# Patient Record
Sex: Female | Born: 1985 | Hispanic: Yes | Marital: Single | Smoking: Never smoker
Health system: Southern US, Community
[De-identification: ages and names within clinical notes are randomized; demographics above are authoritative.]

## PROBLEM LIST (undated history)

## (undated) DIAGNOSIS — Z789 Other specified health status: Secondary | ICD-10-CM

## (undated) DIAGNOSIS — K769 Liver disease, unspecified: Secondary | ICD-10-CM

## (undated) DIAGNOSIS — D649 Anemia, unspecified: Secondary | ICD-10-CM

## (undated) HISTORY — PX: NO PAST SURGERIES: SHX2092

---

## 2010-05-17 ENCOUNTER — Ambulatory Visit (HOSPITAL_COMMUNITY)
Admission: RE | Admit: 2010-05-17 | Discharge: 2010-05-17 | Payer: Self-pay | Source: Home / Self Care | Attending: Family Medicine | Admitting: Family Medicine

## 2010-07-26 ENCOUNTER — Other Ambulatory Visit: Payer: Self-pay | Admitting: Family Medicine

## 2010-07-26 DIAGNOSIS — Z3689 Encounter for other specified antenatal screening: Secondary | ICD-10-CM

## 2010-08-16 ENCOUNTER — Encounter (HOSPITAL_COMMUNITY): Payer: Self-pay

## 2010-08-16 ENCOUNTER — Ambulatory Visit (HOSPITAL_COMMUNITY)
Admission: RE | Admit: 2010-08-16 | Discharge: 2010-08-16 | Disposition: A | Discharge status: Home / Self Care | Payer: Self-pay | Source: Ambulatory Visit | Attending: Family Medicine | Admitting: Family Medicine

## 2010-08-16 DIAGNOSIS — Z3689 Encounter for other specified antenatal screening: Secondary | ICD-10-CM

## 2010-08-16 DIAGNOSIS — O0930 Supervision of pregnancy with insufficient antenatal care, unspecified trimester: Secondary | ICD-10-CM | POA: Insufficient documentation

## 2010-08-16 DIAGNOSIS — O358XX0 Maternal care for other (suspected) fetal abnormality and damage, not applicable or unspecified: Secondary | ICD-10-CM | POA: Insufficient documentation

## 2010-08-24 ENCOUNTER — Other Ambulatory Visit: Payer: Self-pay | Admitting: Family Medicine

## 2010-08-24 DIAGNOSIS — Z1389 Encounter for screening for other disorder: Secondary | ICD-10-CM

## 2010-09-06 ENCOUNTER — Inpatient Hospital Stay (HOSPITAL_COMMUNITY): Admission: AD | Admit: 2010-09-06 | Payer: Self-pay | Admitting: Obstetrics & Gynecology

## 2010-09-06 ENCOUNTER — Ambulatory Visit (HOSPITAL_COMMUNITY)
Admission: RE | Admit: 2010-09-06 | Discharge: 2010-09-06 | Disposition: A | Discharge status: Home / Self Care | Payer: Self-pay | Source: Ambulatory Visit | Attending: Family Medicine | Admitting: Family Medicine

## 2010-09-06 DIAGNOSIS — Z1389 Encounter for screening for other disorder: Secondary | ICD-10-CM

## 2010-09-06 DIAGNOSIS — O365990 Maternal care for other known or suspected poor fetal growth, unspecified trimester, not applicable or unspecified: Secondary | ICD-10-CM | POA: Insufficient documentation

## 2010-09-06 DIAGNOSIS — Z3689 Encounter for other specified antenatal screening: Secondary | ICD-10-CM | POA: Insufficient documentation

## 2010-09-13 ENCOUNTER — Other Ambulatory Visit: Payer: Self-pay | Admitting: Family Medicine

## 2010-09-13 DIAGNOSIS — O288 Other abnormal findings on antenatal screening of mother: Secondary | ICD-10-CM

## 2010-09-20 ENCOUNTER — Ambulatory Visit (HOSPITAL_COMMUNITY)
Admission: RE | Admit: 2010-09-20 | Discharge: 2010-09-20 | Disposition: A | Discharge status: Home / Self Care | Payer: Medicaid Other | Source: Ambulatory Visit | Attending: Family Medicine | Admitting: Family Medicine

## 2010-09-20 DIAGNOSIS — O288 Other abnormal findings on antenatal screening of mother: Secondary | ICD-10-CM

## 2010-09-20 DIAGNOSIS — Z0371 Encounter for suspected problem with amniotic cavity and membrane ruled out: Secondary | ICD-10-CM | POA: Insufficient documentation

## 2010-09-29 ENCOUNTER — Inpatient Hospital Stay (HOSPITAL_COMMUNITY)
Admission: AD | Admit: 2010-09-29 | Discharge: 2010-10-01 | DRG: 775 | Disposition: A | Discharge status: Home / Self Care | Payer: Medicaid Other | Source: Ambulatory Visit | Attending: Family Medicine | Admitting: Family Medicine

## 2010-09-29 ENCOUNTER — Inpatient Hospital Stay (HOSPITAL_COMMUNITY): Payer: Medicaid Other

## 2010-09-29 LAB — CBC
Hemoglobin: 12.1 g/dL (ref 12.0–15.0)
MCH: 31.1 pg (ref 26.0–34.0)
MCV: 89.7 fL (ref 78.0–100.0)
WBC: 11.1 10*3/uL — ABNORMAL HIGH (ref 4.0–10.5)

## 2010-09-29 LAB — RPR: RPR Ser Ql: NONREACTIVE

## 2011-05-11 IMAGING — US US FETAL BPP W/O NONSTRESS
1 series · 13 of 24 positions shown · non-contrast
Comparison: none

[Series 1: us fetal bpp w/o nonstress · non-contrast · 24 acquisitions, 13 frames shown]
[im 1/24]
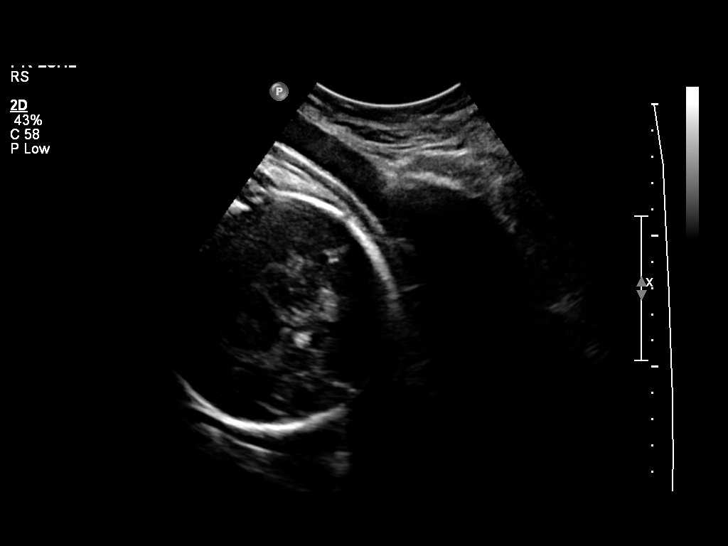
[im 3/24]
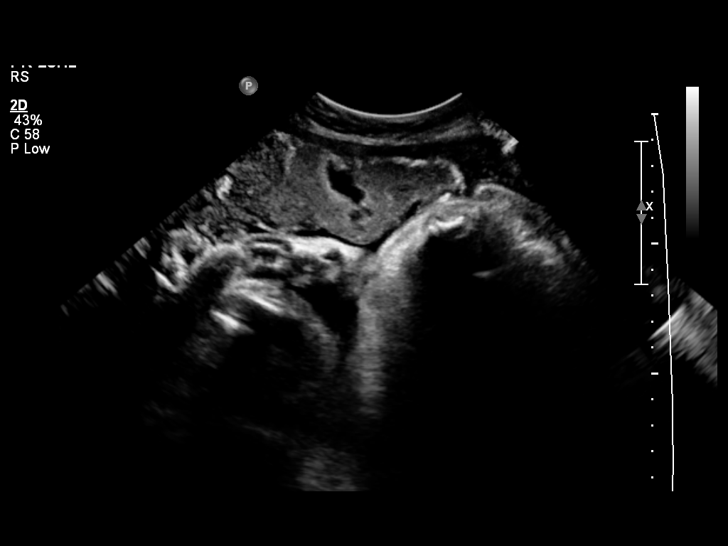
[im 5/24]
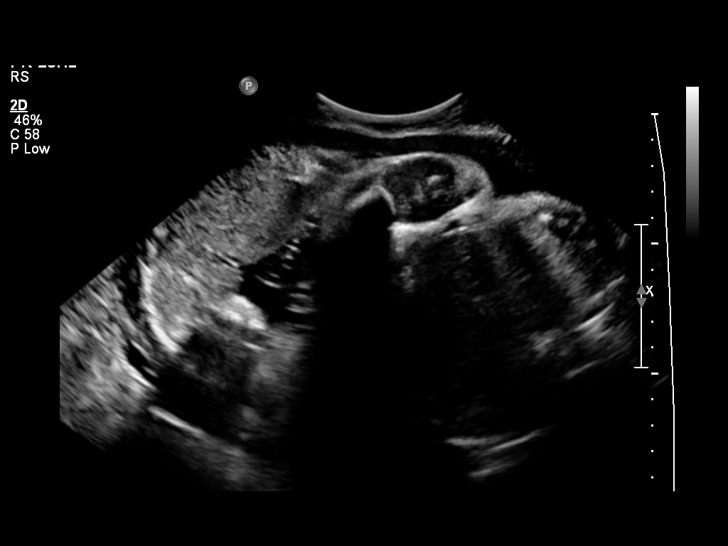
[im 7/24]
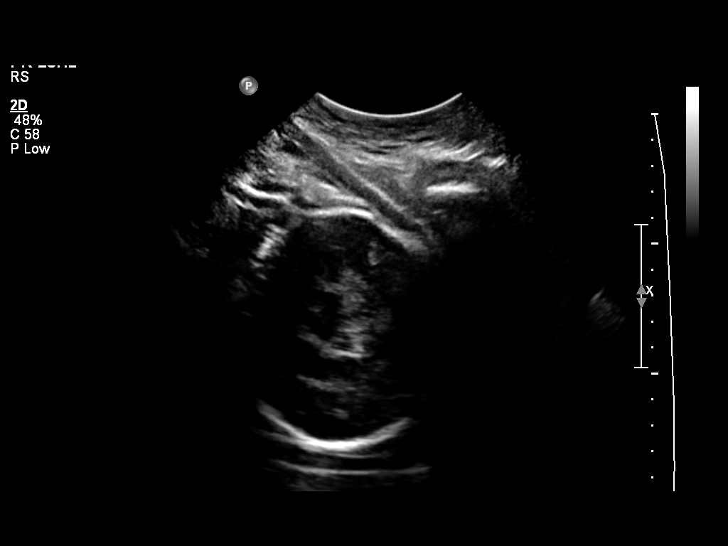
[im 9/24]
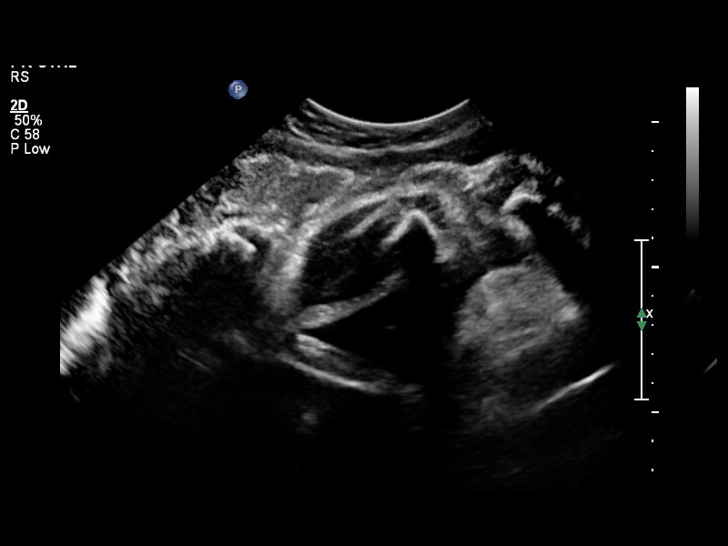
[im 11/24]
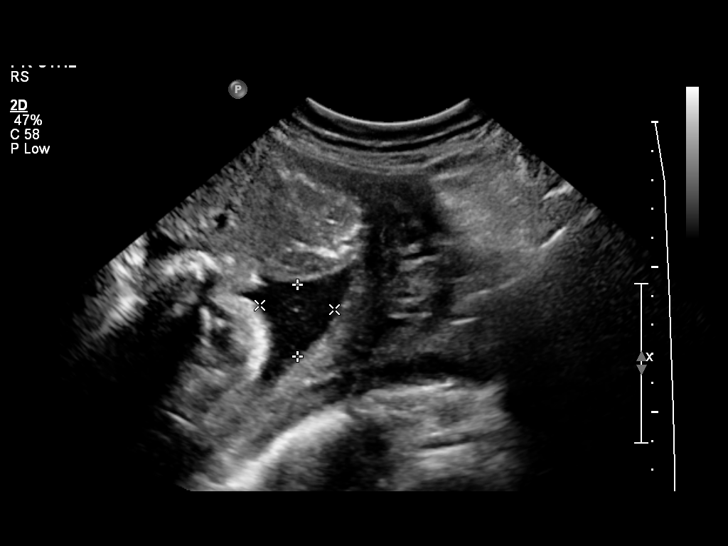
[im 13/24]
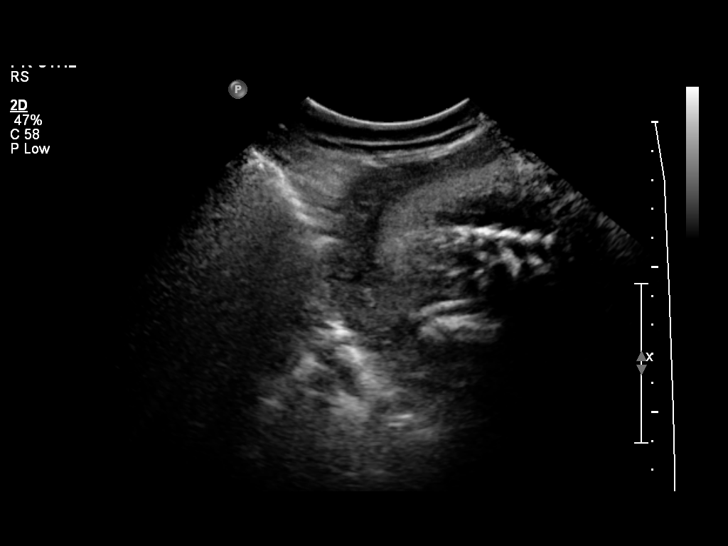
[im 14/24]
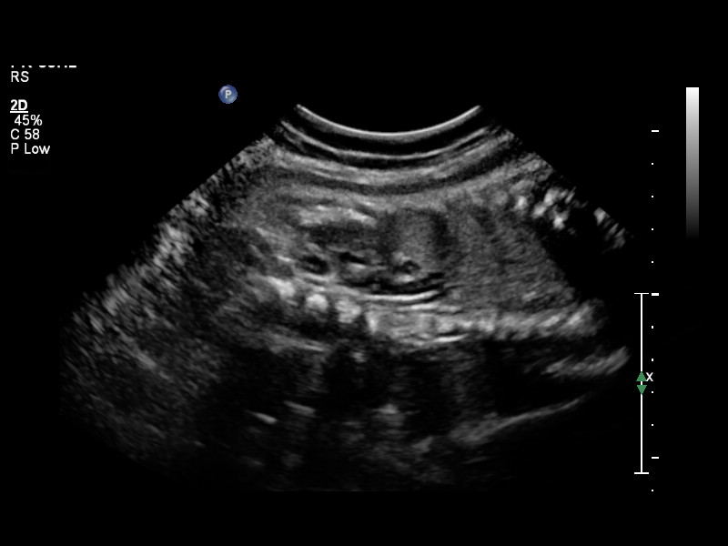
[im 16/24]
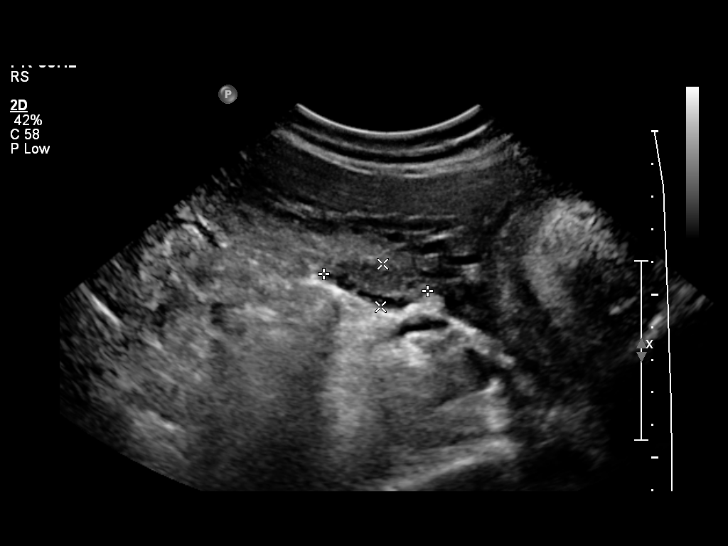
[im 18/24]
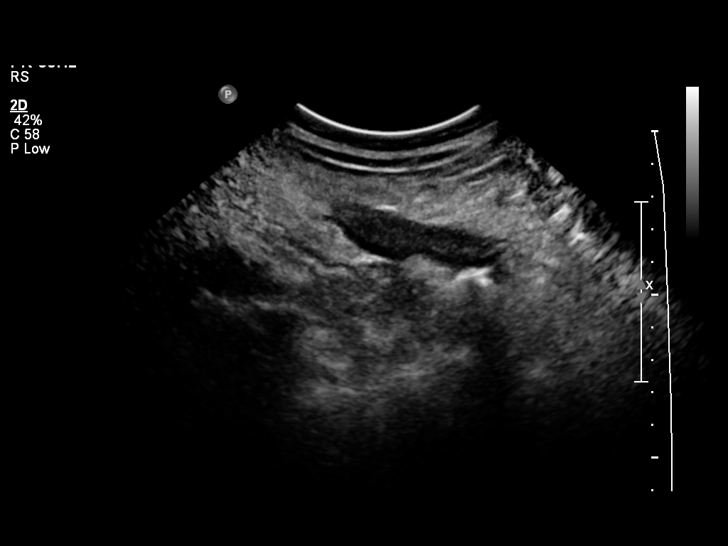
[im 20/24]
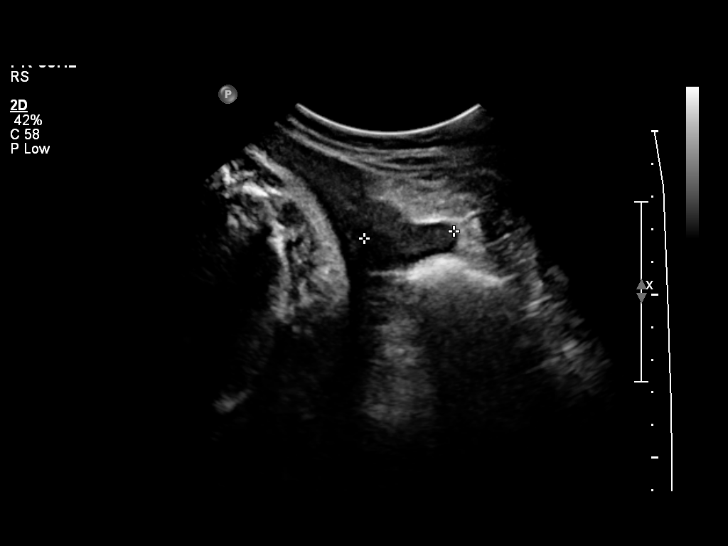
[im 22/24]
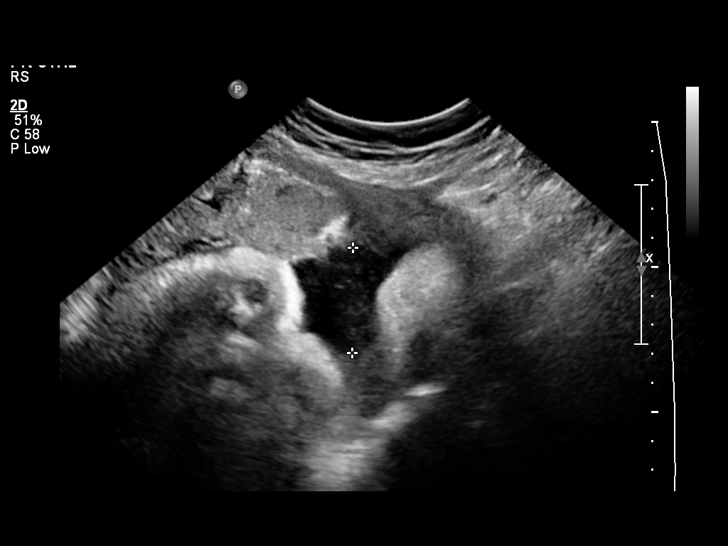
[im 24/24]
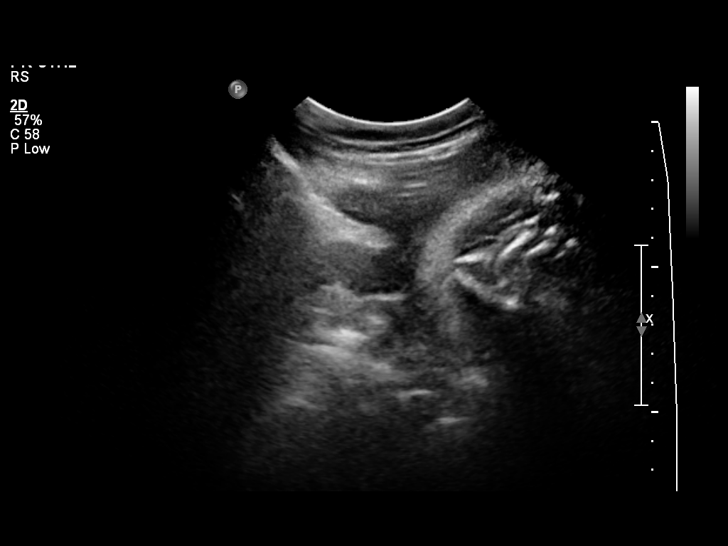

[13 of 24 positions shown; findings below may reference images not displayed]

OBSTETRICS REPORT
                      (Signed Final 10/01/2010 [DATE])

                 24_E
Procedures

 [HOSPITAL]                                         [address].0
Indications

 Non-reactive NST
 Assess fetal well being
 Assess amniotic fluid volume
Fetal Evaluation

 Fetal Heart Rate:  149                          bpm
 Cardiac Activity:  Observed
 Presentation:      Cephalic
 Placenta:          Anterior, above cervical os

 Amniotic Fluid
 AFI FV:      Subjectively low-normal
 AFI Sum:     7.81    cm       11  %Tile     Larg Pckt:    3.19  cm
 RUQ:   2.15    cm   LUQ:    3.19   cm    LLQ:   2.47    cm
Biophysical Evaluation

 Amniotic F.V:   Pocket => 2 cm two         F. Tone:        Observed
                 planes
 F. Movement:    Not Observed               Score:          [DATE]
 F. Breathing:   Observed
Gestational Age

 LMP:           43w 4d        Date:  11/28/09                 EDD:   09/04/10
 Best:          39w 6d     Det. By:  U/S (05/17/10)           EDD:   09/30/10
Cervix Uterus Adnexa

 Cervix:       Not visualized (advanced GA >34 wks)

 Left Ovary:    Within normal limits.
 Right Ovary:   Within normal limits.
 Adnexa:     No abnormality visualized.
Impression

 The AFI is at the lower limits of normal, measuring 7.8 cm.

 [DATE] BPP with no gross fetal movement seen.

 questions or concerns.

## 2011-06-04 NOTE — L&D Delivery Note (Signed)
Agree with above note.  Hughey Rittenberry H. 12/16/2011 11:44 AM

## 2011-06-04 NOTE — L&D Delivery Note (Signed)
Delivery Note At 7:24 AM a viable, healthy and preterm female was delivered via Vaginal, Spontaneous Delivery (Presentation: ; Right Occiput Anterior).  APGAR: 9, 9; weight 4 lb 8 oz (2041 g).   Placenta status: Intact, Spontaneous Pathology.  Cord: 3 vessels with the following complications: Short.    Anesthesia: None  Episiotomy: None Lacerations: 1st degree;Perineal, hemostatic Suture Repair: N/A Est. Blood Loss (mL): 350  Mom to postpartum.  Baby to nursery-stable.  Delivery by Lewie Chamber, Medical student    I was present for and assisted with delivery  LEFTWICH-KIRBY, Flor Houdeshell 12/14/2011, 7:46 AM

## 2011-09-09 ENCOUNTER — Other Ambulatory Visit: Payer: Self-pay

## 2011-09-09 ENCOUNTER — Other Ambulatory Visit (HOSPITAL_COMMUNITY): Payer: Self-pay | Admitting: Physician Assistant

## 2011-09-09 DIAGNOSIS — Z3689 Encounter for other specified antenatal screening: Secondary | ICD-10-CM

## 2011-09-14 LAB — OB RESULTS CONSOLE ABO/RH: RH Type: POSITIVE

## 2011-09-16 ENCOUNTER — Ambulatory Visit (HOSPITAL_COMMUNITY)
Admission: RE | Admit: 2011-09-16 | Discharge: 2011-09-16 | Disposition: A | Discharge status: Home / Self Care | Payer: Medicaid Other | Source: Ambulatory Visit | Attending: Physician Assistant | Admitting: Physician Assistant

## 2011-09-16 DIAGNOSIS — Z363 Encounter for antenatal screening for malformations: Secondary | ICD-10-CM | POA: Insufficient documentation

## 2011-09-16 DIAGNOSIS — Z3689 Encounter for other specified antenatal screening: Secondary | ICD-10-CM

## 2011-09-16 DIAGNOSIS — O358XX0 Maternal care for other (suspected) fetal abnormality and damage, not applicable or unspecified: Secondary | ICD-10-CM | POA: Insufficient documentation

## 2011-09-16 DIAGNOSIS — Z1389 Encounter for screening for other disorder: Secondary | ICD-10-CM | POA: Insufficient documentation

## 2011-10-21 ENCOUNTER — Other Ambulatory Visit (HOSPITAL_COMMUNITY): Payer: Self-pay | Admitting: Family

## 2011-10-21 DIAGNOSIS — Z1389 Encounter for screening for other disorder: Secondary | ICD-10-CM

## 2011-10-25 ENCOUNTER — Ambulatory Visit (HOSPITAL_COMMUNITY)
Admission: RE | Admit: 2011-10-25 | Discharge: 2011-10-25 | Disposition: A | Discharge status: Home / Self Care | Payer: Medicaid Other | Source: Ambulatory Visit | Attending: Family | Admitting: Family

## 2011-10-25 DIAGNOSIS — Z1389 Encounter for screening for other disorder: Secondary | ICD-10-CM

## 2011-10-25 DIAGNOSIS — Z3689 Encounter for other specified antenatal screening: Secondary | ICD-10-CM | POA: Insufficient documentation

## 2011-11-11 ENCOUNTER — Other Ambulatory Visit (HOSPITAL_COMMUNITY): Payer: Self-pay | Admitting: Family

## 2011-11-11 DIAGNOSIS — O358XX0 Maternal care for other (suspected) fetal abnormality and damage, not applicable or unspecified: Secondary | ICD-10-CM

## 2011-11-11 DIAGNOSIS — IMO0002 Reserved for concepts with insufficient information to code with codable children: Secondary | ICD-10-CM

## 2011-11-11 DIAGNOSIS — O35EXX0 Maternal care for other (suspected) fetal abnormality and damage, fetal genitourinary anomalies, not applicable or unspecified: Secondary | ICD-10-CM

## 2011-11-25 ENCOUNTER — Other Ambulatory Visit (HOSPITAL_COMMUNITY): Payer: Self-pay | Admitting: Family

## 2011-11-25 ENCOUNTER — Ambulatory Visit (HOSPITAL_COMMUNITY)
Admission: RE | Admit: 2011-11-25 | Discharge: 2011-11-25 | Disposition: A | Discharge status: Home / Self Care | Payer: Medicaid Other | Source: Ambulatory Visit | Attending: Family | Admitting: Family

## 2011-11-25 ENCOUNTER — Other Ambulatory Visit (HOSPITAL_COMMUNITY): Payer: Self-pay | Admitting: Physician Assistant

## 2011-11-25 DIAGNOSIS — O365990 Maternal care for other known or suspected poor fetal growth, unspecified trimester, not applicable or unspecified: Secondary | ICD-10-CM | POA: Insufficient documentation

## 2011-11-25 DIAGNOSIS — O358XX0 Maternal care for other (suspected) fetal abnormality and damage, not applicable or unspecified: Secondary | ICD-10-CM

## 2011-11-25 DIAGNOSIS — O35EXX0 Maternal care for other (suspected) fetal abnormality and damage, fetal genitourinary anomalies, not applicable or unspecified: Secondary | ICD-10-CM

## 2011-11-25 DIAGNOSIS — IMO0002 Reserved for concepts with insufficient information to code with codable children: Secondary | ICD-10-CM

## 2011-11-25 DIAGNOSIS — Z3689 Encounter for other specified antenatal screening: Secondary | ICD-10-CM

## 2011-11-27 ENCOUNTER — Other Ambulatory Visit: Payer: Self-pay | Admitting: Obstetrics and Gynecology

## 2011-11-27 DIAGNOSIS — Z3689 Encounter for other specified antenatal screening: Secondary | ICD-10-CM

## 2011-12-06 ENCOUNTER — Encounter (HOSPITAL_COMMUNITY): Payer: Self-pay

## 2011-12-06 ENCOUNTER — Inpatient Hospital Stay (HOSPITAL_COMMUNITY): Payer: Self-pay

## 2011-12-06 ENCOUNTER — Inpatient Hospital Stay (HOSPITAL_COMMUNITY)
Admission: AD | Admit: 2011-12-06 | Discharge: 2011-12-06 | Disposition: A | Discharge status: Home / Self Care | Payer: Self-pay | Source: Ambulatory Visit | Attending: Obstetrics & Gynecology | Admitting: Obstetrics & Gynecology

## 2011-12-06 DIAGNOSIS — O4700 False labor before 37 completed weeks of gestation, unspecified trimester: Secondary | ICD-10-CM

## 2011-12-06 DIAGNOSIS — O4690 Antepartum hemorrhage, unspecified, unspecified trimester: Secondary | ICD-10-CM | POA: Insufficient documentation

## 2011-12-06 DIAGNOSIS — O4703 False labor before 37 completed weeks of gestation, third trimester: Secondary | ICD-10-CM

## 2011-12-06 HISTORY — DX: Other specified health status: Z78.9

## 2011-12-06 LAB — URINALYSIS, ROUTINE W REFLEX MICROSCOPIC
Glucose, UA: NEGATIVE mg/dL
Nitrite: NEGATIVE
Protein, ur: NEGATIVE mg/dL
Specific Gravity, Urine: 1.01 (ref 1.005–1.030)
Urobilinogen, UA: 0.2 mg/dL (ref 0.0–1.0)

## 2011-12-06 LAB — URINE MICROSCOPIC-ADD ON

## 2011-12-06 NOTE — MAU Provider Note (Signed)
History  Patient is a 26 yo woman @ [redacted]w[redacted]d, G2P1001, with no PMH who presents with complaints of bright red blood per vagina early this morning approximately 0500. Patient reports upon arising from bed and using the bathroom she noticed a moderate amount of blood coming from her vagina, but notes it was not enough to fill the toilet. Patient also reports a mucous discharge with wiping later in the afternoon as well. Her bleeding has since resolved since this am and now reports small brown spotting with wiping. Patient also reports some lower abdominal cramping with radiation to her middle lower back that comes and goes and at its worst today was 5/10 and currently in MAU reports 4/10. She has not taken any medications for the pain and nothing seems to make it better or worse. Patient denies any contractions as well, simply just cramping and back pain. She has felt good fetal movement throughout the day. Patient denies any fevers, chills, night sweats, N/V/D, constipation, changes in vision or hearing, and no increase in swelling. Patient was last sexually active 3 days ago.  CSN: [id number]  Arrival date and time: 12/06/11 1850   None     Chief Complaint  Patient presents with  . Vaginal Bleeding  . Contractions   HPI  OB History    Grav Para Term Preterm Abortions TAB SAB Ect Mult Living   2 1 1       1       Past Medical History  Diagnosis Date  . No pertinent past medical history     Past Surgical History  Procedure Date  . No past surgeries     History reviewed. No pertinent family history.  History  Substance Use Topics  . Smoking status: Never Smoker   . Smokeless tobacco: Not on file  . Alcohol Use: No    Allergies: No Known Allergies  Prescriptions prior to admission  Medication Sig Dispense Refill  . Prenatal Vit-Fe Fumarate-FA (PRENATAL MULTIVITAMIN) TABS Take 1 tablet by mouth daily.        ROS General: negative for - chills, fever or night  sweats Psychological ROS: negative Ophthalmic ROS: negative Allergy and Immunology ROS: negative Hematological and Lymphatic ROS: negative for - bleeding problems Endocrine ROS: negative for - malaise/lethargy or palpitations Respiratory ROS: no cough, shortness of breath, or wheezing Cardiovascular ROS: no chest pain or dyspnea on exertion Gastrointestinal ROS: positive for - abdominal pain negative for - changes in bowel / bladder Genito-Urinary ROS: no dysuria, trouble voiding, or hematuria Musculoskeletal ROS: negative Neurological ROS: no TIA or stroke symptoms Dermatological ROS: positive for birth mark left back  Physical Exam   General appearance - alert, well appearing, and in no distress Mental status - alert, oriented to person, place, and time Eyes - EOMI Mouth - mucous membranes moist, pharynx normal without lesions Chest - clear to auscultation, no wheezes, rales or rhonchi, symmetric air entry Heart - normal rate, regular rhythm, normal S1, S2, no murmurs, rubs, clicks or gallops Abdomen - gravid, size c/w dates, soft, nontender, no masses or organomegaly Back exam - full range of motion, no tenderness, palpable spasm or pain on motion Neurological - alert, oriented, normal speech, no focal findings or movement disorder noted Musculoskeletal - no joint tenderness, deformity or swelling Extremities - peripheral pulses normal, no pedal edema, no clubbing or cyanosis, pedal edema 1 + B/L LE Skin - middle left back birth mark (6" x 6") Dilation: 1.5 Effacement (%): 50 Station: -3  Exam by:: Dr. Adrian Blackwater  MAU Course  Procedures - [place] limited>>placenta  MDM - [place] ordered and findings discussed with patient (r/o abruption vs. 3rd trimester bleeding with loss of mucous plug) - UA w/ micro shows moderate Hgb, no s/s infection  Assessment and Plan  1.  Vaginal bleeding.  Abruption unlikely as [place] negative.  Likely due to early cervical change.  Precautions given to  patient.  Patient to follow up at health department.    Lewie Chamber 12/06/2011, 9:04 PM   Patient seen and examined.  Agree with above note.  Levie Heritage, DO 12/06/2011 10:35 PM

## 2011-12-06 NOTE — MAU Note (Addendum)
Vaginal bleeding since 0500 this morning, bright red this morning but is now brown in color. Changed pads twice today, pads had a "little bit" of blood on them. Last had intercourse 3 days ago. Contractions since this morning as well, are now every 30 minutes, were every 10 minutes this morning. Positive fetal movement. Denies complications with pregnancy. Gets prenatal care at the health department. Also says has been leaking some water today.

## 2011-12-11 ENCOUNTER — Other Ambulatory Visit: Payer: Self-pay | Admitting: Obstetrics and Gynecology

## 2011-12-11 ENCOUNTER — Encounter (HOSPITAL_COMMUNITY): Payer: Self-pay

## 2011-12-11 ENCOUNTER — Ambulatory Visit (HOSPITAL_COMMUNITY)
Admission: RE | Admit: 2011-12-11 | Discharge: 2011-12-11 | Payer: Self-pay | Source: Ambulatory Visit | Attending: Obstetrics and Gynecology | Admitting: Obstetrics and Gynecology

## 2011-12-11 ENCOUNTER — Ambulatory Visit (HOSPITAL_COMMUNITY)
Admission: RE | Admit: 2011-12-11 | Discharge: 2011-12-11 | Disposition: A | Discharge status: Home / Self Care | Payer: Self-pay | Source: Ambulatory Visit | Attending: Obstetrics and Gynecology | Admitting: Obstetrics and Gynecology

## 2011-12-11 VITALS — BP 111/62 | HR 64 | Wt 125.0 lb

## 2011-12-11 DIAGNOSIS — Z3689 Encounter for other specified antenatal screening: Secondary | ICD-10-CM

## 2011-12-11 DIAGNOSIS — O365990 Maternal care for other known or suspected poor fetal growth, unspecified trimester, not applicable or unspecified: Secondary | ICD-10-CM | POA: Insufficient documentation

## 2011-12-12 ENCOUNTER — Ambulatory Visit (HOSPITAL_COMMUNITY): Payer: Medicaid Other

## 2011-12-14 ENCOUNTER — Encounter (HOSPITAL_COMMUNITY): Payer: Self-pay | Admitting: *Deleted

## 2011-12-14 ENCOUNTER — Inpatient Hospital Stay (HOSPITAL_COMMUNITY)
Admission: AD | Admit: 2011-12-14 | Discharge: 2011-12-16 | DRG: 775 | Disposition: A | Discharge status: Home / Self Care | Payer: Medicaid Other | Source: Ambulatory Visit | Attending: Obstetrics & Gynecology | Admitting: Obstetrics & Gynecology

## 2011-12-14 DIAGNOSIS — O358XX0 Maternal care for other (suspected) fetal abnormality and damage, not applicable or unspecified: Secondary | ICD-10-CM

## 2011-12-14 DIAGNOSIS — O9989 Other specified diseases and conditions complicating pregnancy, childbirth and the puerperium: Secondary | ICD-10-CM

## 2011-12-14 DIAGNOSIS — O99892 Other specified diseases and conditions complicating childbirth: Principal | ICD-10-CM | POA: Diagnosis present

## 2011-12-14 DIAGNOSIS — IMO0001 Reserved for inherently not codable concepts without codable children: Secondary | ICD-10-CM

## 2011-12-14 DIAGNOSIS — Z22330 Carrier of Group B streptococcus: Secondary | ICD-10-CM

## 2011-12-14 LAB — RPR: RPR Ser Ql: NONREACTIVE

## 2011-12-14 LAB — CBC
HCT: 39.3 % (ref 36.0–46.0)
Hemoglobin: 13.5 g/dL (ref 12.0–15.0)
MCH: 31 pg (ref 26.0–34.0)
MCHC: 34.4 g/dL (ref 30.0–36.0)
MCV: 90.3 fL (ref 78.0–100.0)
Platelets: 252 10*3/uL (ref 150–400)
RBC: 4.35 MIL/uL (ref 3.87–5.11)
RDW: 13.8 % (ref 11.5–15.5)
WBC: 13.3 10*3/uL — ABNORMAL HIGH (ref 4.0–10.5)

## 2011-12-14 LAB — TYPE AND SCREEN
ABO/RH(D): A POS
Antibody Screen: NEGATIVE

## 2011-12-14 MED ORDER — WITCH HAZEL-GLYCERIN EX PADS: 1.0000 "application " | MEDICATED_PAD | CUTANEOUS | Status: DC | PRN

## 2011-12-14 MED ORDER — DIPHENHYDRAMINE HCL 25 MG PO CAPS: 25.0000 mg | ORAL_CAPSULE | Freq: Four times a day (QID) | ORAL | Status: DC | PRN

## 2011-12-14 MED ORDER — CITRIC ACID-SODIUM CITRATE 334-500 MG/5ML PO SOLN: 30.0000 mL | ORAL | Status: DC | PRN

## 2011-12-14 MED ORDER — SENNOSIDES-DOCUSATE SODIUM 8.6-50 MG PO TABS
2.0000 | ORAL_TABLET | Freq: Every day | ORAL | Status: DC
  Administered 2011-12-14: 2 via ORAL

## 2011-12-14 MED ORDER — ZOLPIDEM TARTRATE 5 MG PO TABS: 5.0000 mg | ORAL_TABLET | Freq: Every evening | ORAL | Status: DC | PRN

## 2011-12-14 MED ORDER — SODIUM CHLORIDE 0.9 % IV SOLN
2.0000 g | Freq: Once | INTRAVENOUS | Status: AC
  Administered 2011-12-14: 2 g via INTRAVENOUS
  Filled 2011-12-14: qty 2000

## 2011-12-14 MED ORDER — TETANUS-DIPHTH-ACELL PERTUSSIS 5-2.5-18.5 LF-MCG/0.5 IM SUSP
0.5000 mL | Freq: Once | INTRAMUSCULAR | Status: AC
Start: 2011-12-15 — End: 2011-12-15
  Administered 2011-12-15: 0.5 mL via INTRAMUSCULAR
  Filled 2011-12-14: qty 0.5

## 2011-12-14 MED ORDER — NALBUPHINE SYRINGE 5 MG/0.5 ML
5.0000 mg | INJECTION | INTRAMUSCULAR | Status: DC | PRN
  Administered 2011-12-14: 5 mg via INTRAVENOUS
  Filled 2011-12-14: qty 0.5

## 2011-12-14 MED ORDER — OXYTOCIN 40 UNITS IN LACTATED RINGERS INFUSION - SIMPLE MED
62.5000 mL/h | Freq: Once | INTRAVENOUS | Status: AC
  Administered 2011-12-14: 999 mL/h via INTRAVENOUS
  Filled 2011-12-14: qty 1000

## 2011-12-14 MED ORDER — BENZOCAINE-MENTHOL 20-0.5 % EX AERO: 1.0000 "application " | INHALATION_SPRAY | CUTANEOUS | Status: DC | PRN

## 2011-12-14 MED ORDER — ONDANSETRON HCL 4 MG/2ML IJ SOLN: 4.0000 mg | INTRAMUSCULAR | Status: DC | PRN

## 2011-12-14 MED ORDER — LACTATED RINGERS IV SOLN
INTRAVENOUS | Status: DC
  Administered 2011-12-14: 04:00:00 via INTRAVENOUS

## 2011-12-14 MED ORDER — LACTATED RINGERS IV SOLN: 500.0000 mL | INTRAVENOUS | Status: DC | PRN

## 2011-12-14 MED ORDER — LIDOCAINE HCL (PF) 1 % IJ SOLN
30.0000 mL | INTRAMUSCULAR | Status: DC | PRN
  Filled 2011-12-14: qty 30

## 2011-12-14 MED ORDER — OXYTOCIN BOLUS FROM INFUSION
250.0000 mL | Freq: Once | INTRAVENOUS | Status: DC
  Filled 2011-12-14: qty 500

## 2011-12-14 MED ORDER — IBUPROFEN 600 MG PO TABS
600.0000 mg | ORAL_TABLET | Freq: Four times a day (QID) | ORAL | Status: DC | PRN
  Administered 2011-12-14: 600 mg via ORAL
  Filled 2011-12-14: qty 1

## 2011-12-14 MED ORDER — OXYCODONE-ACETAMINOPHEN 5-325 MG PO TABS
1.0000 | ORAL_TABLET | ORAL | Status: DC | PRN
  Administered 2011-12-14: 1 via ORAL
  Filled 2011-12-14: qty 1

## 2011-12-14 MED ORDER — ACETAMINOPHEN 325 MG PO TABS: 650.0000 mg | ORAL_TABLET | ORAL | Status: DC | PRN

## 2011-12-14 MED ORDER — FLEET ENEMA 7-19 GM/118ML RE ENEM: 1.0000 | ENEMA | RECTAL | Status: DC | PRN

## 2011-12-14 MED ORDER — ONDANSETRON HCL 4 MG PO TABS: 4.0000 mg | ORAL_TABLET | ORAL | Status: DC | PRN

## 2011-12-14 MED ORDER — ONDANSETRON HCL 4 MG/2ML IJ SOLN: 4.0000 mg | Freq: Four times a day (QID) | INTRAMUSCULAR | Status: DC | PRN

## 2011-12-14 MED ORDER — PRENATAL MULTIVITAMIN CH
1.0000 | ORAL_TABLET | Freq: Every day | ORAL | Status: DC
  Administered 2011-12-15 – 2011-12-16 (×2): 1 via ORAL
  Filled 2011-12-14 (×2): qty 1

## 2011-12-14 MED ORDER — IBUPROFEN 600 MG PO TABS
600.0000 mg | ORAL_TABLET | Freq: Four times a day (QID) | ORAL | Status: DC
  Administered 2011-12-15 – 2011-12-16 (×5): 600 mg via ORAL
  Filled 2011-12-14 (×8): qty 1

## 2011-12-14 MED ORDER — DIBUCAINE 1 % RE OINT: 1.0000 "application " | TOPICAL_OINTMENT | RECTAL | Status: DC | PRN

## 2011-12-14 MED ORDER — SIMETHICONE 80 MG PO CHEW: 80.0000 mg | CHEWABLE_TABLET | ORAL | Status: DC | PRN

## 2011-12-14 MED ORDER — OXYCODONE-ACETAMINOPHEN 5-325 MG PO TABS: 1.0000 | ORAL_TABLET | ORAL | Status: DC | PRN

## 2011-12-14 MED ORDER — LANOLIN HYDROUS EX OINT: TOPICAL_OINTMENT | CUTANEOUS | Status: DC | PRN

## 2011-12-14 NOTE — Progress Notes (Deleted)
S: This is a 26 year old G2P1001 at [redacted]w[redacted]d by LMP presenting with SOL. Patient reports that her contractions began last night approximately 10pm and she continues to have contractions approximately every 7 minutes. She denies any ROM.  Complications this pregnancy: none  Contractions: every 5-7 min Membranes: intact Vaginal bleeding: none Vaginal discharge: none Fetal movement: present  O: Filed Vitals:   12/14/11 0428  BP: 139/88  Pulse: 61  Temp: 97.6 F (36.4 C)  Resp: 20   Review of Systems -  General: negative for - chills, fever or night sweats Psychological ROS: negative Ophthalmic ROS: negative ENT ROS: negative Allergy and Immunology ROS: negative Hematological and Lymphatic ROS: negative Endocrine ROS: negative Respiratory ROS: no cough, shortness of breath, or wheezing Cardiovascular ROS: no chest pain or dyspnea on exertion Gastrointestinal ROS: no abdominal pain, change in bowel habits, or black or bloody stools Genito-Urinary ROS: no dysuria, trouble voiding, or hematuria Musculoskeletal ROS: negative Neurological ROS: no TIA or stroke symptoms Dermatological ROS: negative  Physical Examination:  General appearance - oriented to person, place, and time and in active labor Mental status - normal mood, behavior, speech, dress, motor activity, and thought processes Eyes - pupils equal and reactive, extraocular eye movements intact Mouth - mucous membranes moist, pharynx normal without lesions Chest - clear to auscultation, no wheezes, rales or rhonchi, symmetric air entry Heart - normal rate, regular rhythm, normal S1, S2, no murmurs, rubs, clicks or gallops Abdomen - gravid, size< dates, no scars Back exam - full range of motion, no tenderness, palpable spasm or pain on motion Neurological - alert, oriented, normal speech, no focal findings or movement disorder noted Musculoskeletal - no joint tenderness, deformity or swelling Extremities - pedal edema 1 + B/L  LE, intact peripheral pulses Skin - normal coloration and turgor, no rashes, no suspicious skin lesions noted  SVE: Dilation: 6.5 Effacement (%): 80 Station: -2 Presentation: Vertex Exam by:: Sharen Counter CNM Spec exam: not performed FHT: 142, mod variability, + accels, no decels Ctx: q60min  Prenatal labs: ABO, Rh:  A positive Antibody:  Neg Rubella:  Imm RPR:   NR HBsAg: Neg  HIV:   Neg GBS: Positive   Hgb/Plt:  11.1 / 294 1hr GTT: 93 (normal) 1st trimester screen: too late   Prenatal Transfer Tool  Maternal Diabetes: No Genetic Screening: Declined--late to care Maternal Ultrasounds/Referrals: Abnormal:  Findings:   Other: Please see prenatal record for details (size < dates) Fetal Ultrasounds or other Referrals:  None Maternal Substance Abuse:  No Significant Maternal Medications:  None Significant Maternal Lab Results: None   Labs:  Results for orders placed during the hospital encounter of 12/14/11 (from the past 48 hour(s))  CBC     Status: Abnormal   Collection Time   12/14/11  4:35 AM      Component Value Range Comment   WBC 13.3 (*) 4.0 - 10.5 K/uL    RBC 4.35  3.87 - 5.11 MIL/uL    Hemoglobin 13.5  12.0 - 15.0 g/dL    HCT 40.9  81.1 - 91.4 %    MCV 90.3  78.0 - 100.0 fL    MCH 31.0  26.0 - 34.0 pg    MCHC 34.4  30.0 - 36.0 g/dL    RDW 78.2  95.6 - 21.3 %    Platelets 252  150 - 400 K/uL       A/P: 26 year old G2P1001 @ [redacted]w[redacted]d presents with SOL 1. Admit to  L&D 2. Expectant management, anticipate vaginal delivery 3. Antibiotics, ampicillin 4. Continuous toco/FHT 5. Plans for IUD after birth

## 2011-12-14 NOTE — H&P (Signed)
S: This is a 26 year old G2P1001 at [redacted]w[redacted]d by LMP presenting with SOL. Patient reports that her contractions began last night approximately 10pm and she continues to have contractions approximately every 7 minutes. She denies any ROM.  Complications this pregnancy: none  Contractions: every 5-7 min Membranes: intact Vaginal bleeding: none Vaginal discharge: none Fetal movement: present  O: Filed Vitals:   12/14/11 0428  BP: 139/88  Pulse: 61  Temp: 97.6 F (36.4 C)  Resp: 20   Review of Systems -  General: negative for - chills, fever or night sweats Psychological ROS: negative Ophthalmic ROS: negative ENT ROS: negative Allergy and Immunology ROS: negative Hematological and Lymphatic ROS: negative Endocrine ROS: negative Respiratory ROS: no cough, shortness of breath, or wheezing Cardiovascular ROS: no chest pain or dyspnea on exertion Gastrointestinal ROS: no abdominal pain, change in bowel habits, or black or bloody stools Genito-Urinary ROS: no dysuria, trouble voiding, or hematuria Musculoskeletal ROS: negative Neurological ROS: no TIA or stroke symptoms Dermatological ROS: negative  Physical Examination:  General appearance - oriented to person, place, and time and in active labor Mental status - normal mood, behavior, speech, dress, motor activity, and thought processes Eyes - pupils equal and reactive, extraocular eye movements intact Mouth - mucous membranes moist, pharynx normal without lesions Chest - clear to auscultation, no wheezes, rales or rhonchi, symmetric air entry Heart - normal rate, regular rhythm, normal S1, S2, no murmurs, rubs, clicks or gallops Abdomen - gravid, size< dates, no scars Back exam - full range of motion, no tenderness, palpable spasm or pain on motion Neurological - alert, oriented, normal speech, no focal findings or movement disorder noted Musculoskeletal - no joint tenderness, deformity or swelling Extremities - pedal edema 1 + B/L  LE, intact peripheral pulses Skin - normal coloration and turgor, no rashes, no suspicious skin lesions noted  SVE: Dilation: 6.5 Effacement (%): 80 Station: -2 Presentation: Vertex Exam by:: Sharen Counter CNM Spec exam: not performed FHT: 142, mod variability, + accels, no decels Ctx: q44min  Prenatal labs: ABO, Rh:  A positive Antibody:  Neg Rubella:  Imm RPR:   NR HBsAg: Neg  HIV:   Neg GBS: Positive   Hgb/Plt:  11.1 / 294 1hr GTT: 93 (normal) 1st trimester screen: too late   Prenatal Transfer Tool  Maternal Diabetes: No Genetic Screening: Declined--late to care Maternal Ultrasounds/Referrals: Abnormal:  Findings:   Other: Please see prenatal record for details (size < dates) Fetal Ultrasounds or other Referrals:  None Maternal Substance Abuse:  No Significant Maternal Medications:  None Significant Maternal Lab Results: None   Labs:  Results for orders placed during the hospital encounter of 12/14/11 (from the past 48 hour(s))  CBC     Status: Abnormal   Collection Time   12/14/11  4:35 AM      Component Value Range Comment   WBC 13.3 (*) 4.0 - 10.5 K/uL    RBC 4.35  3.87 - 5.11 MIL/uL    Hemoglobin 13.5  12.0 - 15.0 g/dL    HCT 40.9  81.1 - 91.4 %    MCV 90.3  78.0 - 100.0 fL    MCH 31.0  26.0 - 34.0 pg    MCHC 34.4  30.0 - 36.0 g/dL    RDW 78.2  95.6 - 21.3 %    Platelets 252  150 - 400 K/uL       A/P: 26 year old G2P1001 @ [redacted]w[redacted]d presents with SOL 1. Admit to  L&D 2. Expectant management, anticipate vaginal delivery 3. Antibiotics, ampicillin 4. Continuous toco/FHT 5. Plans for IUD after birth  I have seen this patient and agree with the above medical student's note.  LEFTWICH-KIRBY, Nekita Pita Certified Nurse-Midwife

## 2011-12-14 NOTE — Progress Notes (Signed)
Pt delivered viable female SVD. Lisa Leftwich-Kirby present,Girguis, med student assist.

## 2011-12-14 NOTE — Progress Notes (Signed)
Report called to [facility] 2 in BS. To BS via stretcher.

## 2011-12-14 NOTE — Progress Notes (Signed)
CNM at bedside.

## 2011-12-14 NOTE — MAU Note (Signed)
I'm having a lot of contractions since 2200. They're getting stronger and closer. No leaking or bleeding

## 2011-12-15 NOTE — Progress Notes (Signed)
Post Partum Day 2 Subjective: no complaints and desires early d/c home  Objective: Blood pressure 93/53, pulse 63, temperature 98.1 F (36.7 C), temperature source Oral, resp. rate 18, height 4\' 9"  (1.448 m), weight 122 lb (55.339 kg), unknown if currently breastfeeding.  Physical Exam:  General: alert, cooperative and no distress Lochia: appropriate Uterine Fundus: firm Incision: n/a DVT Evaluation: No evidence of DVT seen on physical exam.   Basename 12/14/11 0435  HGB 13.5  HCT 39.3    Assessment/Plan: Plan for discharge tomorrow and Breastfeeding Nursery denied baby d/c at 24 hours   LOS: 1 day   Amanda Cooley E. 12/15/2011, 9:22 AM

## 2011-12-16 DIAGNOSIS — O358XX0 Maternal care for other (suspected) fetal abnormality and damage, not applicable or unspecified: Secondary | ICD-10-CM

## 2011-12-16 MED ORDER — IBUPROFEN 600 MG PO TABS: 600.0000 mg | ORAL_TABLET | Freq: Four times a day (QID) | ORAL | Status: AC

## 2011-12-16 NOTE — Progress Notes (Signed)
Ur chart review completed.  

## 2011-12-16 NOTE — Discharge Summary (Signed)
Obstetric Discharge Summary Reason for Admission: onset of labor Prenatal Procedures: NST Intrapartum Procedures: spontaneous vaginal delivery Postpartum Procedures: none Complications-Operative and Postpartum: none Hemoglobin  Date Value Range Status  12/14/2011 13.5  12.0 - 15.0 g/dL Final     HCT  Date Value Range Status  12/14/2011 39.3  36.0 - 46.0 % Final   Hospital Course: Came in in active labor. Progressed quickly to SVD. Wanted to go home on Day 1 but baby was not released. Postpartum course has been uneventful.  Physical Exam:  General: alert and no distress Lochia: appropriate Uterine Fundus: firm Incision:  DVT Evaluation: No evidence of DVT seen on physical exam.  Discharge Diagnoses: Term Pregnancy-delivered  Discharge Information: Date: 12/16/2011 Activity: unrestricted and pelvic rest Diet: routine Medications: PNV and Ibuprofen Condition: stable Instructions: refer to practice specific booklet Discharge to: home Follow-up Information    Follow up with [facility]. Schedule an appointment as soon as possible for a visit in 6 weeks.       Plans IUD for contraception  Newborn Data: Live born female  Birth Weight: 4 lb 8 oz (2041 g) APGAR: 9, 9  Home with mother.  [facility] 12/16/2011, 7:58 AM

## 2011-12-17 NOTE — H&P (Signed)
Agree with above note.  Amanda Cooley H. 12/17/2011 11:41 AM  

## 2011-12-18 ENCOUNTER — Ambulatory Visit (HOSPITAL_COMMUNITY): Payer: Medicaid Other

## 2011-12-25 ENCOUNTER — Ambulatory Visit (HOSPITAL_COMMUNITY): Payer: Medicaid Other

## 2011-12-31 ENCOUNTER — Inpatient Hospital Stay (HOSPITAL_COMMUNITY): Admission: RE | Admit: 2011-12-31 | Payer: Self-pay | Source: Ambulatory Visit

## 2012-04-27 IMAGING — US US OB DETAIL+14 WK
1 series · 12 of 28 positions shown · non-contrast
Comparison: none

[Series 1: us ob detail +14 wk · 12 of 81 slices shown]
[im 3/81]
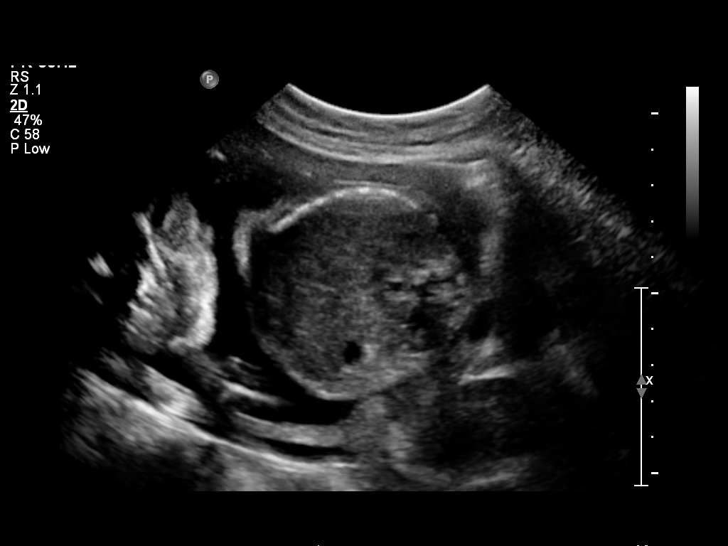
[im 9/81]
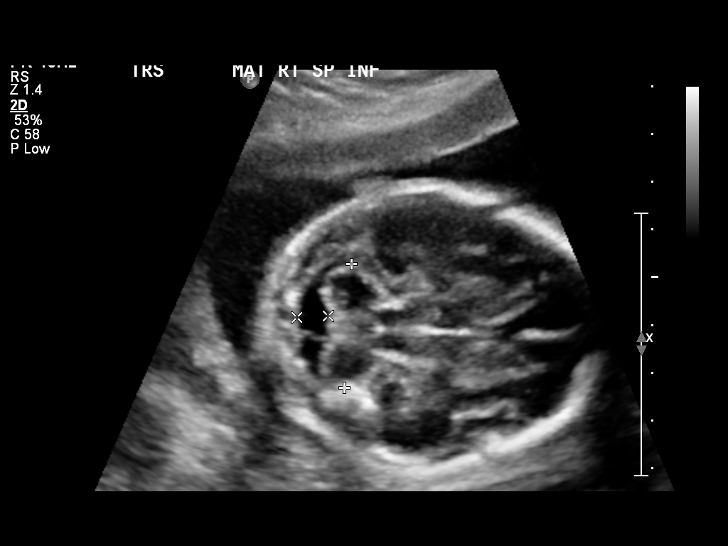
[im 15/81]
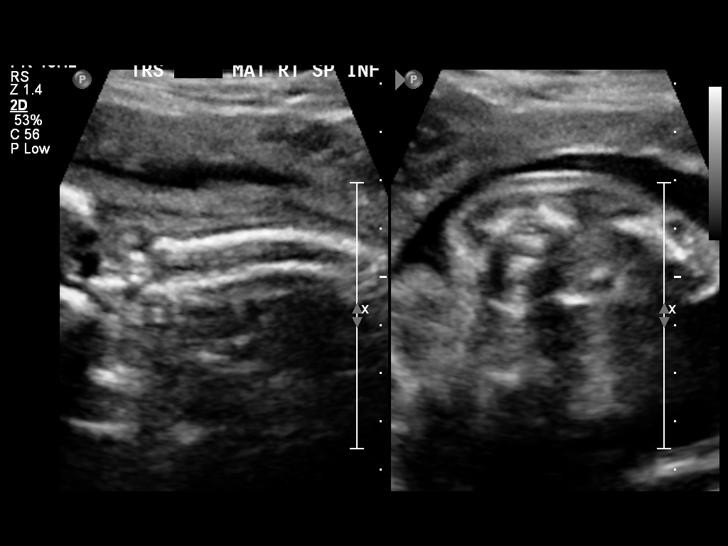
[im 24/81]
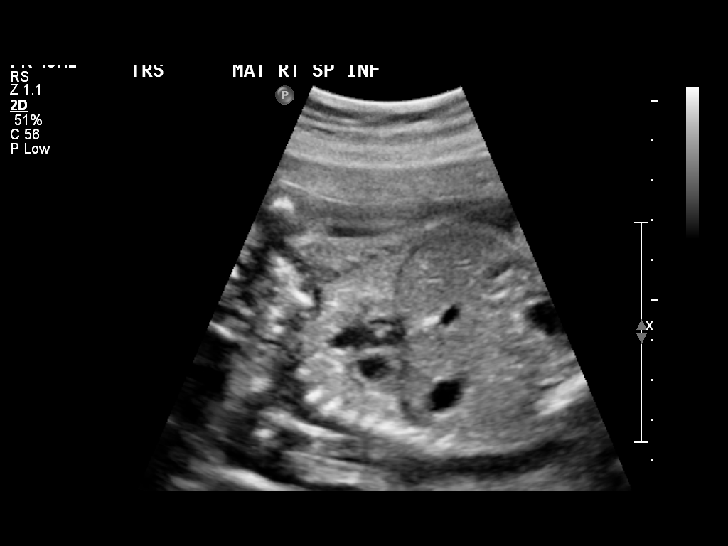
[im 30/81]
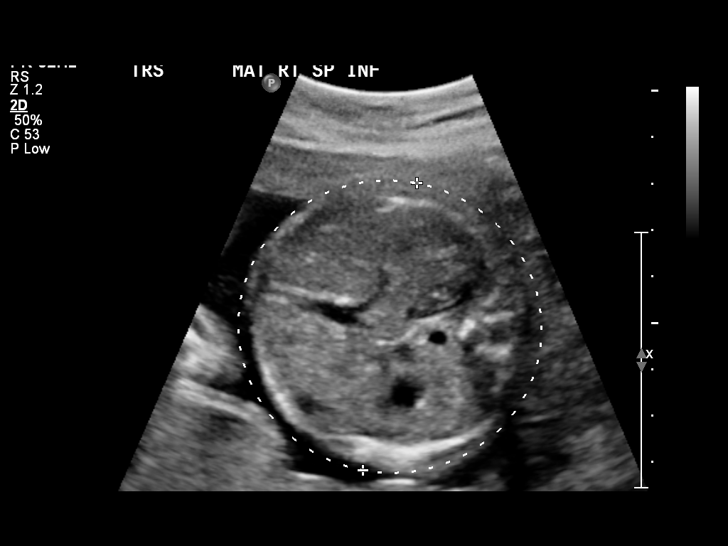
[im 36/81]
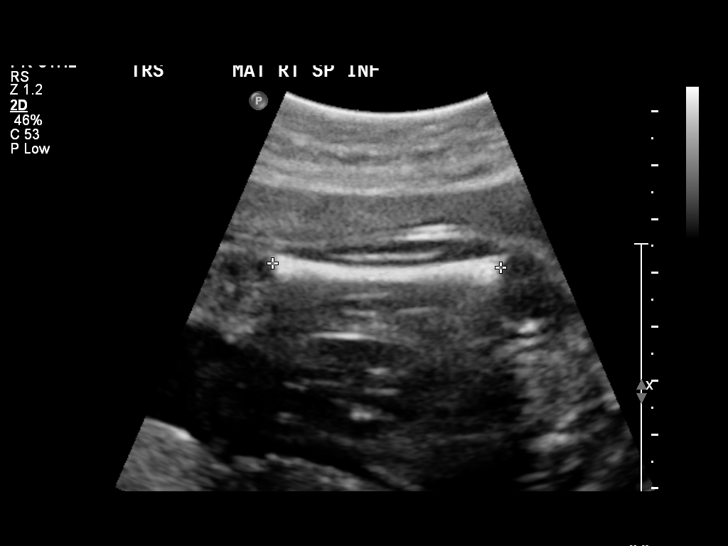
[im 45/81]
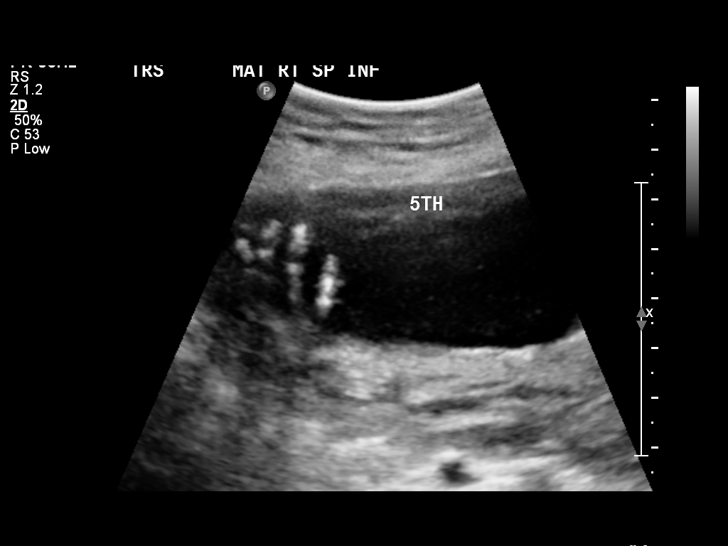
[im 51/81]
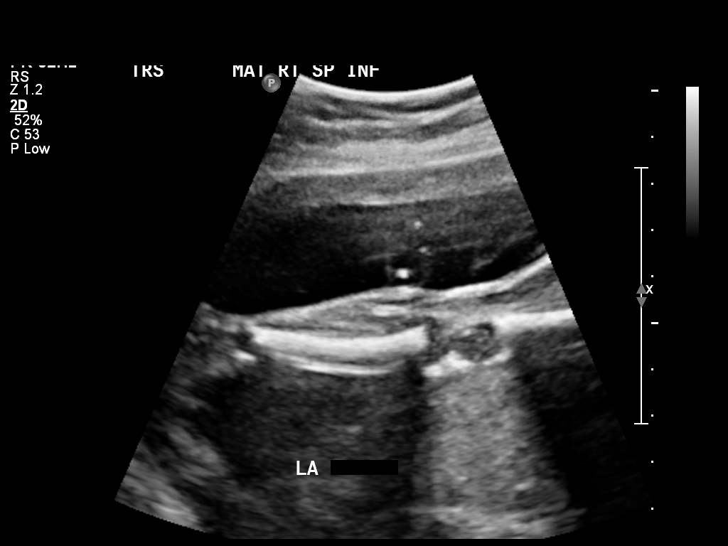
[im 57/81]
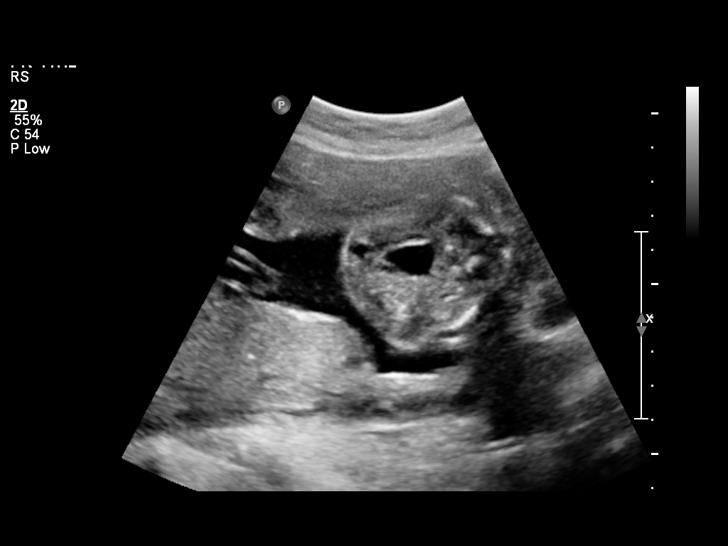
[im 66/81]
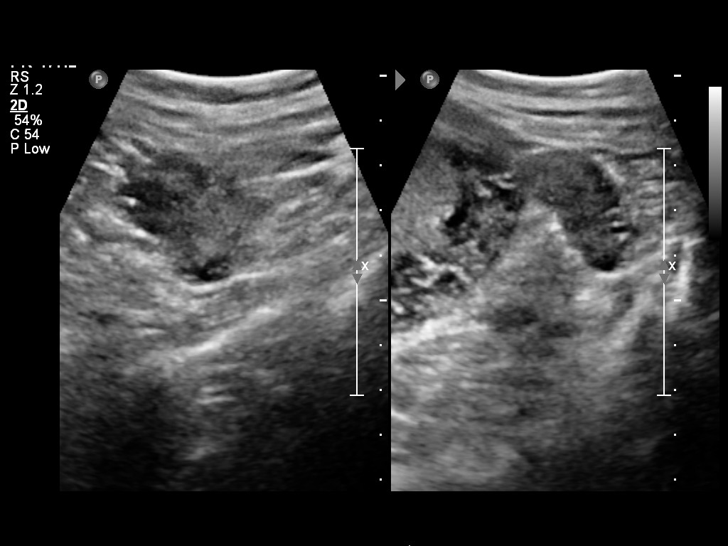
[im 72/81]
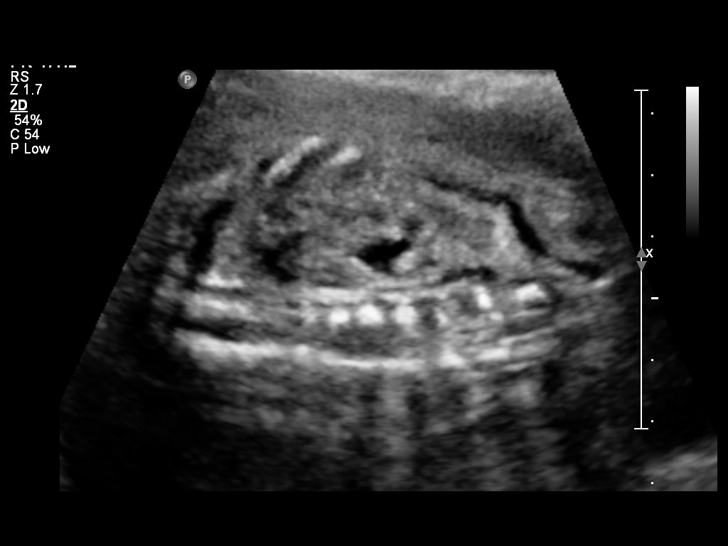
[im 78/81]
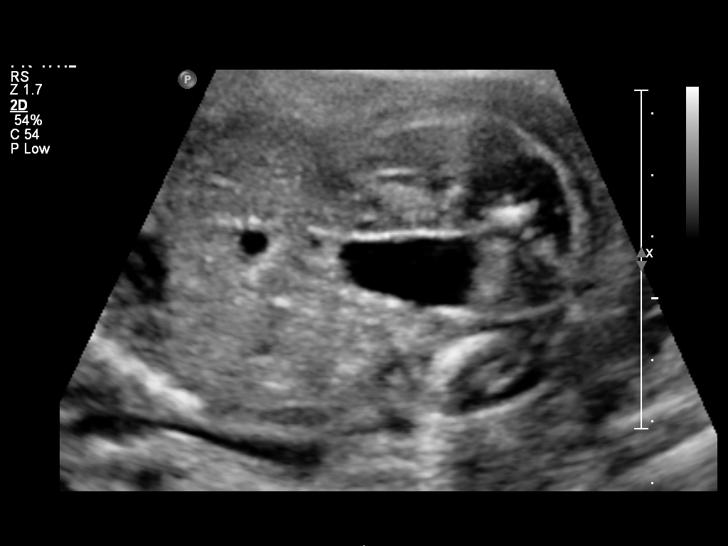

[12 of 28 positions shown; findings below may reference images not displayed]

OBSTETRICS REPORT
                      (Signed Final 09/16/2011 [DATE])

 Order#:         [id number]
Procedures

 US OB DETAIL + 14 WK                                  76811.0
Indications

 Detailed fetal anatomic survey
 Unsure of LMP;  Establish Gestational [AGE]
Fetal Evaluation

 Fetal Heart Rate:  161                         bpm
 Cardiac Activity:  Observed
 Fetal Lie:         Spine inferior
 Presentation:      Transverse, head to
                    maternal right
 Placenta:          Left lateral, above cervical
                    os
 P. Cord            Visualized
 Insertion:

 Amniotic Fluid
 AFI FV:      Subjectively within normal limits
                                             Larg Pckt:     4.6  cm
Biometry

 BPD:     59.1  mm    G. Age:   24w 1d                CI:        76.55   70 - 86
                                                      FL/HC:      19.7   18.7 -

 HC:       214  mm    G. Age:   23w 4d       20  %    HC/AC:      1.10   1.05 -

 AC:     195.4  mm    G. Age:   24w 2d       53  %    FL/BPD:     71.2   71 - 87
 FL:      42.1  mm    G. Age:   23w 5d       33  %    FL/AC:      21.5   20 - 24

 Est. FW:     646  gm      1 lb 7 oz     54  %
Gestational Age

 LMP:           25w 4d       Date:   03/21/11                 EDD:   12/26/11
 U/S Today:     23w 6d                                        EDD:   01/07/12
 Best:          23w 6d    Det. By:   U/S (09/16/11)           EDD:   01/07/12
Anatomy
 Cranium:           Appears normal      Aortic Arch:       Not well
                                                           visualized
 Fetal Cavum:       Appears normal      Ductal Arch:       Not well
                                                           visualized
 Ventricles:        Appears normal      Diaphragm:         Appears normal
 Choroid Plexus:    Appears normal      Stomach:           Appears
                                                           normal, left
                                                           sided
 Cerebellum:        Appears normal      Abdomen:           Appears normal
 Posterior Fossa:   Appears normal      Abdominal Wall:    Appears nml
                                                           (cord insert,
                                                           abd wall)
 Nuchal Fold:       Appears normal      Cord Vessels:      Appears normal
                    (neck, nuchal                          (3 vessel cord)
                    fold)
 Face:              Appears normal      Kidneys:           Appear normal
                    (lips/profile/orbit
                    s)
 Heart:             Appears normal      Bladder:           Appears normal
                    (4 chamber &
                    axis)
 RVOT:              Not well            Spine:             Not well
                    visualized                             visualized
 LVOT:              Appears normal      Limbs:             Appears normal
                                                           (hands, ankles,
                                                           feet)

 Other:     Heels and 5th digit appears normal. Fetus appears to be
            a female. Technically difficult due to fetal position.
Cervix Uterus Adnexa

 Cervical Length:   3.1       cm

 Cervix:       Normal appearance by transabdominal scan.
 Uterus:       No abnormality visualized.
 Cul De Sac:   No free fluid seen.
 Left Ovary:   Within normal limits.
 Right Ovary:  Within normal limits.

 Adnexa:     No abnormality visualized.
Impression

 Single live IUP in transverse, head maternal right,
 presentation.    No anomaly seen in visualized structures as
 listed above.
 Measuring nearly 2 weeks behind assigned gestational age
 by LMP. This could indicate inaccuracy of the LMP, but could
 also indicate small for gestational age fetus. Follow up to
 assess growth is recommended.

 MACARIOCHACAJ with us.  Please do not hesitate to

## 2014-04-04 ENCOUNTER — Encounter (HOSPITAL_COMMUNITY): Payer: Self-pay | Admitting: *Deleted

## 2019-09-21 ENCOUNTER — Emergency Department (HOSPITAL_COMMUNITY): Payer: No Typology Code available for payment source

## 2019-09-21 ENCOUNTER — Encounter (HOSPITAL_COMMUNITY): Payer: Self-pay | Admitting: Emergency Medicine

## 2019-09-21 ENCOUNTER — Other Ambulatory Visit: Payer: Self-pay

## 2019-09-21 ENCOUNTER — Emergency Department (HOSPITAL_COMMUNITY)
Admission: EM | Admit: 2019-09-21 | Discharge: 2019-09-21 | Disposition: A | Discharge status: Home / Self Care | Payer: No Typology Code available for payment source | Attending: Emergency Medicine | Admitting: Emergency Medicine

## 2019-09-21 DIAGNOSIS — R0789 Other chest pain: Secondary | ICD-10-CM | POA: Diagnosis present

## 2019-09-21 DIAGNOSIS — Z79899 Other long term (current) drug therapy: Secondary | ICD-10-CM | POA: Insufficient documentation

## 2019-09-21 DIAGNOSIS — Y93I9 Activity, other involving external motion: Secondary | ICD-10-CM | POA: Insufficient documentation

## 2019-09-21 DIAGNOSIS — Y999 Unspecified external cause status: Secondary | ICD-10-CM | POA: Insufficient documentation

## 2019-09-21 DIAGNOSIS — Y92410 Unspecified street and highway as the place of occurrence of the external cause: Secondary | ICD-10-CM | POA: Insufficient documentation

## 2019-09-21 MED ORDER — NAPROXEN 500 MG PO TABS: 500.0000 mg | ORAL_TABLET | Freq: Two times a day (BID) | ORAL | 0 refills | Status: DC

## 2019-09-21 MED ORDER — IBUPROFEN 400 MG PO TABS
600.0000 mg | ORAL_TABLET | Freq: Once | ORAL | Status: AC
  Administered 2019-09-21: 600 mg via ORAL
  Filled 2019-09-21: qty 1

## 2019-09-21 MED ORDER — METHOCARBAMOL 500 MG PO TABS: 500.0000 mg | ORAL_TABLET | Freq: Three times a day (TID) | ORAL | 0 refills | Status: DC | PRN

## 2019-09-21 MED ORDER — OXYCODONE-ACETAMINOPHEN 5-325 MG PO TABS
1.0000 | ORAL_TABLET | Freq: Once | ORAL | Status: AC
  Administered 2019-09-21: 14:00:00 1 via ORAL
  Filled 2019-09-21: qty 1
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
## 2019-10-06 ENCOUNTER — Ambulatory Visit: Payer: No Typology Code available for payment source

## 2019-10-06 ENCOUNTER — Other Ambulatory Visit: Payer: Self-pay

## 2020-03-16 ENCOUNTER — Inpatient Hospital Stay (HOSPITAL_COMMUNITY)
Admission: EM | Admit: 2020-03-16 | Discharge: 2020-03-16 | Disposition: A | Discharge status: Home / Self Care | Payer: Self-pay | Attending: Obstetrics and Gynecology | Admitting: Obstetrics and Gynecology

## 2020-03-16 ENCOUNTER — Encounter (HOSPITAL_COMMUNITY): Payer: Self-pay | Admitting: *Deleted

## 2020-03-16 DIAGNOSIS — Z6790 Unspecified blood type, Rh positive: Secondary | ICD-10-CM | POA: Insufficient documentation

## 2020-03-16 DIAGNOSIS — O039 Complete or unspecified spontaneous abortion without complication: Secondary | ICD-10-CM | POA: Insufficient documentation

## 2020-03-16 LAB — URINALYSIS, ROUTINE W REFLEX MICROSCOPIC
Leukocytes,Ua: NEGATIVE
Nitrite: NEGATIVE

## 2020-03-16 LAB — POCT PREGNANCY, URINE: Preg Test, Ur: NEGATIVE

## 2020-03-16 LAB — CBC
HCT: 42.2 % (ref 36.0–46.0)
Hemoglobin: 13.8 g/dL (ref 12.0–15.0)
MCH: 29.2 pg (ref 26.0–34.0)
MCHC: 32.7 g/dL (ref 30.0–36.0)
MCV: 89.4 fL (ref 80.0–100.0)
Platelets: 304 10*3/uL (ref 150–400)
RBC: 4.72 MIL/uL (ref 3.87–5.11)
RDW: 12.2 % (ref 11.5–15.5)
WBC: 12.8 10*3/uL — ABNORMAL HIGH (ref 4.0–10.5)
nRBC: 0 % (ref 0.0–0.2)

## 2020-03-16 LAB — URINALYSIS, MICROSCOPIC (REFLEX): RBC / HPF: >50 RBC/hpf (ref 0–5)

## 2020-03-16 LAB — HCG, QUANTITATIVE, PREGNANCY: hCG, Beta Chain, Quant, S: 5 m[IU]/mL — ABNORMAL HIGH (ref <5)

## 2020-03-16 NOTE — MAU Provider Note (Signed)
S [name] is a 34 y.o. [id number] @[redacted]w[redacted]d  by LMP who presents to MAU today with complaint of VB. Reports onset around 6am. She has stained 2 pads. Not passing clots. Having mild LAP. Rates 5/10. Has not taken anything for it. Reports +HPT on 02/22/20. She also had a qhcg drawn on 03/10/20 at [facility] that was 8 (pt brought records with her).   O BP 134/68   Pulse 69   Temp 98.3 F (36.8 C) (Oral)   Resp 16   LMP 01/21/2020   SpO2 100%  Physical Exam Vitals and nursing note reviewed.  Constitutional:      General: She is not in acute distress.    Appearance: Normal appearance.  HENT:     Head: Normocephalic and atraumatic.  Cardiovascular:     Rate and Rhythm: Normal rate.  Pulmonary:     Effort: Pulmonary effort is normal. No respiratory distress.  Neurological:     Mental Status: She is alert.  Psychiatric:        Mood and Affect: Mood normal.        Behavior: Behavior normal.    Results for orders placed or performed during the hospital encounter of 03/16/20 (from the past 24 hour(s))  Pregnancy, urine POC     Status: None   Collection Time: 03/16/20 11:12 AM  Result Value Ref Range   Preg Test, Ur NEGATIVE NEGATIVE  Urinalysis, Routine w reflex microscopic     Status: Abnormal   Collection Time: 03/16/20 11:14 AM  Result Value Ref Range   Color, Urine RED (A) YELLOW   APPearance TURBID (A) CLEAR   Specific Gravity, Urine  1.005 - 1.030    TEST NOT REPORTED DUE TO COLOR INTERFERENCE OF URINE PIGMENT   pH  5.0 - 8.0    TEST NOT REPORTED DUE TO COLOR INTERFERENCE OF URINE PIGMENT   Glucose, UA (A) NEGATIVE mg/dL    TEST NOT REPORTED DUE TO COLOR INTERFERENCE OF URINE PIGMENT   Hgb urine dipstick (A) NEGATIVE    TEST NOT REPORTED DUE TO COLOR INTERFERENCE OF URINE PIGMENT   Bilirubin Urine (A) NEGATIVE    TEST NOT REPORTED DUE TO COLOR INTERFERENCE OF URINE PIGMENT   Ketones, ur (A) NEGATIVE mg/dL    TEST NOT REPORTED DUE TO COLOR  INTERFERENCE OF URINE PIGMENT   Protein, ur (A) NEGATIVE mg/dL    TEST NOT REPORTED DUE TO COLOR INTERFERENCE OF URINE PIGMENT   Nitrite NEGATIVE NEGATIVE   Leukocytes,Ua NEGATIVE NEGATIVE  Urinalysis, Microscopic (reflex)     Status: Abnormal   Collection Time: 03/16/20 11:14 AM  Result Value Ref Range   RBC / HPF >50 0 - 5 RBC/hpf   WBC, UA 6-10 0 - 5 WBC/hpf   Bacteria, UA RARE (A) NONE SEEN   Squamous Epithelial / LPF 0-5 0 - 5   Mucus PRESENT   hCG, quantitative, pregnancy     Status: Abnormal   Collection Time: 03/16/20 11:38 AM  Result Value Ref Range   hCG, Beta Chain, Quant, S 5 (H) <5 mIU/mL  CBC     Status: Abnormal   Collection Time: 03/16/20 11:38 AM  Result Value Ref Range   WBC 12.8 (H) 4.0 - 10.5 K/uL   RBC 4.72 3.87 - 5.11 MIL/uL   Hemoglobin 13.8 12.0 - 15.0 g/dL   HCT 03/18/20 36 - 46 %   MCV 89.4 80.0 - 100.0 fL   MCH 29.2 26.0 - 34.0 pg  MCHC 32.7 30.0 - 36.0 g/dL   RDW 81.2 75.1 - 70.0 %   Platelets 304 150 - 400 K/uL   nRBC 0.0 0.0 - 0.2 %   MDM: UPT neg, qhcg ordered and reviewed. Discussed results with pt>failed pregnancy. Pt verbalizes understanding. Stable for discharge home.   A 1. Miscarriage   2. Blood type, Rh positive    P Discharge from MAU in stable condition Follow up at [facility] in 1-2 weeks Patient may return to MAU as needed for pregnancy related complaints  Interpreter present for all encounters  Donette Larry, [place] 03/16/2020 2:00 PM

## 2020-03-16 NOTE — ED Triage Notes (Signed)
Emergency Medicine Provider OB Triage Evaluation Note  Amanda Cooley is a 34 y.o. female, G2P1102, at Unknown gestation who presents to the emergency department with complaints of vaginal bleeding that began this morning, patient is [redacted] weeks pregnant.  Patient also complaining of small suprapubic pain.  No nausea, vomiting, diarrhea, fevers.  No OB doctor.  Review of  Systems  Positive: Vaginal bleeding, suprapubic pain Negative: Fever, nausea, vomiting.  Physical Exam  BP 134/68   Pulse 69   Temp 98.3 F (36.8 C) (Oral)   Resp 16   SpO2 100%  General: Awake, no distress  HEENT: Atraumatic  Resp: Normal effort  Cardiac: Normal rate Abd: Tender to suprapubic region. MSK: Moves all extremities without difficulty Neuro: Speech clear  Medical Decision Making  Pt evaluated for pregnancy concern and is stable for transfer to MAU. Pt is in agreement with plan for transfer.  9:42 AM Discussed with MAU APP, Denny Peon, who accepts patient in transfer.  Clinical Impression  No diagnosis found.     Farrel Gordon, PA-C 03/16/20 [phone]

## 2020-03-16 NOTE — MAU Note (Signed)
Pt reports she started having some vag bleeding this morning with cramping in her back like a period. Last intercourse was Monday. Has positive pregnancy test from [facility]. (Hormone level 8)

## 2020-03-16 NOTE — ED Triage Notes (Signed)
34 year-old spanish speaking female arrives to ED via POV with a chief complaint of vaginal bleeding and [redacted] weeks pregnant   Her LMP was 01/21/20, pregnancy confirmed at [facility] center. Pt is currently wearing a pad which she has had to change once since 6am. She currently has no pain. This is her 3rd pregnancy, 2 living children.

## 2020-03-16 NOTE — Discharge Instructions (Signed)
Aborto espontneo Miscarriage El aborto espontneo es la prdida de un beb que no ha nacido (feto) antes de la semana20 del embarazo. Siga estas indicaciones en su casa: Medicamentos   Tome los medicamentos de venta libre y los recetados solamente como se lo haya indicado el mdico.  Si le recetaron un antibitico, tmelo como se lo haya indicado el mdico. No deje de tomar los antibiticos aunque comience a sentirse mejor.  No tome antiinflamatorios no esteroideos (AINE), a menos que el mdico le diga que son seguros para usted. Estos incluyen aspirina e ibuprofeno. Estos medicamentos pueden provocarle sangrado. Actividad  Haga reposo segn lo indicado. Pregntele al mdico qu actividades son seguras para usted.  Pida ayuda para realizar las tareas de la casa durante este tiempo. Instrucciones generales  Anote cuntos apsitos usa por da y cun saturados estn.  Observe la cantidad de tejido o grumos de sangre (cogulos de sangre) que expulsa por la vagina. Guarde las cantidades grandes de tejido para llevrselas al mdico.  No use tampones, no se haga duchas vaginales ni tenga relaciones sexuales hasta que el mdico la autorice.  Para que usted y su pareja puedan sobrellevar el proceso de duelo, hable con su mdico o busque apoyo psicolgico.  Cuando est lista, acuda al mdico para hablar sobre los pasos que debe seguir para cuidar su salud. Adems, hable con su mdico sobre las medidas que debe adoptar para tener un embarazo saludable en el futuro.  Concurra a todas las visitas de seguimiento como se lo haya indicado el mdico. Esto es importante. Comunquese con un mdico si:  Tiene fiebre o siente escalofros.  Tiene una secrecin vaginal con mal olor.  Aumenta el sangrado. Solicite ayuda de inmediato si:  Tiene espasmos o dolor muy intensos en el abdomen o en la espalda.  Elimina grumos de sangre por la vagina, que tienen el tamao de una nuez o ms.  Elimina  tejido por la vagina, que tiene el tamao de una nuez o ms.  Empapa ms de un apsito de tamao normal por hora.  Se siente dbil o mareada.  Pierde el conocimiento (se desmaya).  Siente tristeza que no se va o piensa en lastimarse. Resumen  El aborto espontneo es la prdida de un beb que no ha nacido antes de la semana20 del embarazo.  Siga las indicaciones de su mdico para el cuidado en su hogar. Concurra a todas las visitas de control.  Para que usted y su pareja puedan sobrellevar el proceso de duelo, hable con su mdico o busque apoyo psicolgico. Esta informacin no tiene como fin reemplazar el consejo del mdico. Asegrese de hacerle al mdico cualquier pregunta que tenga. Document Revised: 02/24/2017 Document Reviewed: 02/24/2017 Elsevier Patient Education  2020 Elsevier Inc.  

## 2020-07-31 ENCOUNTER — Encounter: Payer: Self-pay | Admitting: Nurse Practitioner

## 2021-09-09 ENCOUNTER — Inpatient Hospital Stay (HOSPITAL_COMMUNITY)
Admission: AD | Admit: 2021-09-09 | Discharge: 2021-09-09 | Disposition: A | Discharge status: Home / Self Care | Payer: Self-pay | Attending: Obstetrics & Gynecology | Admitting: Obstetrics & Gynecology

## 2021-09-09 ENCOUNTER — Encounter (HOSPITAL_COMMUNITY): Payer: Self-pay

## 2021-09-09 DIAGNOSIS — Z3A36 36 weeks gestation of pregnancy: Secondary | ICD-10-CM | POA: Diagnosis not present

## 2021-09-09 DIAGNOSIS — O479 False labor, unspecified: Secondary | ICD-10-CM

## 2021-09-09 DIAGNOSIS — O4703 False labor before 37 completed weeks of gestation, third trimester: Secondary | ICD-10-CM | POA: Insufficient documentation

## 2021-09-09 HISTORY — DX: Liver disease, unspecified: K76.9

## 2021-09-09 HISTORY — DX: Anemia, unspecified: D64.9

## 2021-09-09 NOTE — MAU Note (Signed)
.  Amanda Cooley is a 36 y.o. at [redacted]w[redacted]d here in MAU reporting: Irregular ctx starting around 0000 this morning. Reports lower abdominal pain that radiates to her back. Reports that she has had limited prenatal care and sees a primary doctor in [facility] in [place]. Has hx of anemia, liver issues, and hyperglycemia. She takes medications for the liver issues and anemia but is unsure of what they are called. No diagnosis of diabetes. Denies VB or LOF. Endorses +FM. ? ?Onset of complaint: 0000 09/09/2021 ?Pain score: 6/10  ?Vitals:  ? 09/09/21 1026  ?BP: 115/60  ?Pulse: 75  ?Resp: 17  ?Temp: 98.9 ?F (37.2 ?C)  ?SpO2: 100%  ?   ?FHT:135  ?   ? ?

## 2021-09-09 NOTE — MAU Note (Signed)
I have communicated with Cleone Slim, CNM and reviewed vital signs:  ?Vitals:  ? 09/09/21 1038 09/09/21 1141  ?BP: 127/63 110/62  ?Pulse: 78 79  ?Resp:    ?Temp:    ?SpO2:  98%  ?  ?Vaginal exam:  Dilation: 1 ?Effacement (%): Thick ?Cervical Position: Posterior ?Exam by:: Cleone Slim, CNM,  ? ?Also reviewed contraction pattern and that non-stress test is reactive.  It has been documented that patient is contracting every 7-10 minutes with minimal cervical change over 1 hour not indicating active labor.  Patient denies any other complaints.  Based on this report provider has given order for discharge.  A discharge order and diagnosis entered by a provider.   ?Labor discharge instructions reviewed with patient. Patient verbalized understanding on when to return to the hospital.     ? ? ? ? ? ?

## 2021-09-09 NOTE — Discharge Instructions (Signed)

## 2022-05-11 ENCOUNTER — Emergency Department (HOSPITAL_BASED_OUTPATIENT_CLINIC_OR_DEPARTMENT_OTHER): Admit: 2022-05-11 | Payer: No Typology Code available for payment source | Source: Home / Self Care
